# Patient Record
Sex: Female | Born: 1975
Health system: Southern US, Community
[De-identification: ages and names within clinical notes are randomized; demographics above are authoritative.]

## PROBLEM LIST (undated history)

## (undated) ENCOUNTER — Emergency Department (HOSPITAL_COMMUNITY): Discharge status: Against Medical Advice | Payer: Self-pay

## (undated) DIAGNOSIS — F3281 Premenstrual dysphoric disorder: Secondary | ICD-10-CM

## (undated) DIAGNOSIS — F909 Attention-deficit hyperactivity disorder, unspecified type: Secondary | ICD-10-CM

## (undated) DIAGNOSIS — F419 Anxiety disorder, unspecified: Secondary | ICD-10-CM

## (undated) HISTORY — PX: TONSILLECTOMY: SUR1361

## (undated) HISTORY — PX: HERNIA REPAIR: SHX51

---

## 2004-01-05 ENCOUNTER — Inpatient Hospital Stay (HOSPITAL_COMMUNITY): Admission: RE | Admit: 2004-01-05 | Discharge: 2004-01-07 | Payer: Self-pay | Admitting: Psychiatry

## 2016-11-08 ENCOUNTER — Encounter (HOSPITAL_BASED_OUTPATIENT_CLINIC_OR_DEPARTMENT_OTHER): Payer: Self-pay | Admitting: Emergency Medicine

## 2016-11-08 ENCOUNTER — Emergency Department (HOSPITAL_BASED_OUTPATIENT_CLINIC_OR_DEPARTMENT_OTHER): Payer: 59

## 2016-11-08 ENCOUNTER — Emergency Department (HOSPITAL_BASED_OUTPATIENT_CLINIC_OR_DEPARTMENT_OTHER)
Admission: EM | Admit: 2016-11-08 | Discharge: 2016-11-08 | Disposition: A | Discharge status: Home / Self Care | Payer: 59 | Attending: Emergency Medicine | Admitting: Emergency Medicine

## 2016-11-08 DIAGNOSIS — R509 Fever, unspecified: Secondary | ICD-10-CM | POA: Diagnosis present

## 2016-11-08 DIAGNOSIS — N760 Acute vaginitis: Secondary | ICD-10-CM | POA: Diagnosis not present

## 2016-11-08 DIAGNOSIS — Z79899 Other long term (current) drug therapy: Secondary | ICD-10-CM | POA: Insufficient documentation

## 2016-11-08 DIAGNOSIS — F909 Attention-deficit hyperactivity disorder, unspecified type: Secondary | ICD-10-CM | POA: Insufficient documentation

## 2016-11-08 DIAGNOSIS — F17210 Nicotine dependence, cigarettes, uncomplicated: Secondary | ICD-10-CM | POA: Diagnosis not present

## 2016-11-08 HISTORY — DX: Anxiety disorder, unspecified: F41.9

## 2016-11-08 HISTORY — DX: Premenstrual dysphoric disorder: F32.81

## 2016-11-08 HISTORY — DX: Attention-deficit hyperactivity disorder, unspecified type: F90.9

## 2016-11-08 LAB — URINALYSIS, ROUTINE W REFLEX MICROSCOPIC
Bilirubin Urine: NEGATIVE
GLUCOSE, UA: NEGATIVE mg/dL
HGB URINE DIPSTICK: NEGATIVE
Ketones, ur: NEGATIVE mg/dL
Leukocytes, UA: NEGATIVE
Nitrite: NEGATIVE
Protein, ur: NEGATIVE mg/dL
SPECIFIC GRAVITY, URINE: 1.006 (ref 1.005–1.030)
pH: 7 (ref 5.0–8.0)

## 2016-11-08 LAB — COMPREHENSIVE METABOLIC PANEL
ALT: 9 U/L — AB (ref 14–54)
AST: 13 U/L — AB (ref 15–41)
Albumin: 4.1 g/dL (ref 3.5–5.0)
Alkaline Phosphatase: 53 U/L (ref 38–126)
Anion gap: 5 (ref 5–15)
BILIRUBIN TOTAL: 0.4 mg/dL (ref 0.3–1.2)
BUN: 8 mg/dL (ref 6–20)
CALCIUM: 8.9 mg/dL (ref 8.9–10.3)
CO2: 27 mmol/L (ref 22–32)
CREATININE: 0.7 mg/dL (ref 0.44–1.00)
Chloride: 102 mmol/L (ref 101–111)
GFR calc Af Amer: >60 mL/min (ref >60)
Glucose, Bld: 73 mg/dL (ref 65–99)
POTASSIUM: 4 mmol/L (ref 3.5–5.1)
Sodium: 134 mmol/L — ABNORMAL LOW (ref 135–145)
TOTAL PROTEIN: 6.1 g/dL — AB (ref 6.5–8.1)

## 2016-11-08 LAB — WET PREP, GENITAL
Sperm: NONE SEEN
Trich, Wet Prep: NONE SEEN
YEAST WET PREP: NONE SEEN

## 2016-11-08 LAB — CBC
HEMATOCRIT: 35.2 % — AB (ref 36.0–46.0)
Hemoglobin: 12.6 g/dL (ref 12.0–15.0)
MCH: 30.4 pg (ref 26.0–34.0)
MCHC: 35.8 g/dL (ref 30.0–36.0)
MCV: 85 fL (ref 78.0–100.0)
Platelets: 153 10*3/uL (ref 150–400)
RBC: 4.14 MIL/uL (ref 3.87–5.11)
RDW: 12 % (ref 11.5–15.5)
WBC: 7.3 10*3/uL (ref 4.0–10.5)

## 2016-11-08 LAB — PREGNANCY, URINE: Preg Test, Ur: NEGATIVE

## 2016-11-08 LAB — SEDIMENTATION RATE: Sed Rate: 0 mm/hr (ref 0–22)

## 2016-11-08 LAB — T4, FREE: FREE T4: 0.78 ng/dL (ref 0.61–1.12)

## 2016-11-08 LAB — TSH: TSH: 1.899 u[IU]/mL (ref 0.350–4.500)

## 2016-11-08 MED ORDER — METRONIDAZOLE 500 MG PO TABS: 500.0000 mg | ORAL_TABLET | Freq: Two times a day (BID) | ORAL | 0 refills | Status: DC

## 2016-11-08 MED FILL — metroNIDAZOLE 500 MG TABS: 500 | 7 days supply | Qty: 14 | Fill #0

## 2016-11-08 NOTE — ED Notes (Signed)
ED Provider at bedside. 

## 2016-11-08 NOTE — ED Triage Notes (Signed)
Pt reports fevers ranging from 99.4-100.5 for three months. Was at her psychiatrist appointment this am and was told to have it addressed with ua and cbc. She went to [facility] and they sent her here. Known to have tick bites but reports none this season. NAD at triage.

## 2016-11-08 NOTE — Discharge Instructions (Signed)
It was our pleasure to provide your ER care today - we hope that you feel better.  For vaginal discharge, take flagyl (antibiotic) as prescribed.  As we discussed, some test results remain pending, and the results should be back in the next 1-2 days.   Follow up with primary care doctor in the next 1-2 weeks - call to arrange appointment.   Return to ER if worse, new symptoms, new or severe pain, high fevers, other concern.

## 2016-11-08 NOTE — ED Notes (Signed)
Patient transported to X-ray 

## 2016-11-08 NOTE — ED Notes (Signed)
ED Provider at bedside for update 

## 2016-11-08 NOTE — ED Provider Notes (Signed)
[facility]   CSN: [id number] Arrival date & time: 11/08/16  7829     History   Chief Complaint Chief Complaint  Patient presents with  . Fever    HPI Lauren Frazier is a 41 y.o. female.  Patient c/o low grade fever in the past 3 months, t 99-100.  Denies other associated symptoms, no chills/sweats, no joint pain, no myalgias, no rash/skin lesions, no cough or uri c/o, no headaches, no sob or unusual doe, no abd pain, no vomiting or diarrhea, no dysuria or gu c/o.  Pt states approximately 3 months ago did have a couple shots for planned trip to [place].  Also states around that time only med change was new med Intuniv.  Denies any tick bites/ticks on body in the past year. Denies hx arthritis, rheumatologic, or autoimmune illness. No recent significant wt loss or gain. No skin changes. No heat intolerance. Also states had a new sexual contact 2-3 weeks ago, and has mild whitish vaginal discharge - requests eval for that as well. Denies pelvic or vaginal pain.    The history is provided by the patient.  Fever   Pertinent negatives include no chest pain, no diarrhea, no vomiting, no headaches, no sore throat and no cough.    Past Medical History:  Diagnosis Date  . ADHD   . Anxiety   . PMDD (premenstrual dysphoric disorder)     There are no active problems to display for this patient.   Past Surgical History:  Procedure Laterality Date  . CESAREAN SECTION    . HERNIA REPAIR    . TONSILLECTOMY      OB History    No data available       Home Medications    Prior to Admission medications   Medication Sig Start Date End Date Taking? Authorizing Provider  diazepam (VALIUM) 2 MG tablet Take 5 mg by mouth every 6 (six) hours as needed for anxiety.   Yes [provider]  guanFACINE (INTUNIV) 2 MG TB24 ER tablet Take 2 mg by mouth daily.   Yes [provider]  lamoTRIgine (LAMICTAL) 100 MG tablet Take 100 mg by mouth 2 (two)  times daily.   Yes [provider]  norethindrone (MICRONOR,CAMILA,ERRIN) 0.35 MG tablet Take 1 tablet by mouth daily.   Yes [provider]    Family History No family history on file.  Social History Social History  Substance Use Topics  . Smoking status: Current Every Day Smoker    Types: E-cigarettes  . Smokeless tobacco: Never Used  . Alcohol use No     Allergies   Patient has no known allergies.   Review of Systems Review of Systems  Constitutional: Positive for fever. Negative for chills.  HENT: Negative for sinus pain and sore throat.   Eyes: Negative for redness.  Respiratory: Negative for cough and shortness of breath.   Cardiovascular: Negative for chest pain.  Gastrointestinal: Negative for abdominal pain, diarrhea and vomiting.  Genitourinary: Negative for dysuria, flank pain and pelvic pain.  Musculoskeletal: Negative for arthralgias, back pain, joint swelling, myalgias and neck pain.  Skin: Negative for rash.  Neurological: Negative for headaches.  Hematological: Negative for adenopathy.  Psychiatric/Behavioral: Negative for confusion.     Physical Exam Updated Vital Signs BP (!) 104/54 (BP Location: Left Arm)   Pulse 78   Temp 98.7 F (37.1 C) (Oral)   Resp 18   Ht 5\' 4"  (1.626 m)   Wt 56.7  kg   LMP 10/22/2016 (Exact Date)   SpO2 99%   BMI 21.46 kg/m   Physical Exam  Constitutional: She appears well-developed and well-nourished. No distress.  HENT:  Nose: Nose normal.  Mouth/Throat: Oropharynx is clear and moist.  Eyes: Conjunctivae are normal. No scleral icterus.  Neck: Neck supple. No tracheal deviation present. No thyromegaly present.  Cardiovascular: Normal rate, regular rhythm, normal heart sounds and intact distal pulses.  Exam reveals no gallop and no friction rub.   No murmur heard. Pulmonary/Chest: Effort normal and breath sounds normal. No respiratory distress.  Abdominal: Soft. Normal appearance and bowel  sounds are normal. She exhibits no distension and no mass. There is no tenderness. There is no rebound and no guarding. No hernia.  No hsm.   Genitourinary:  Genitourinary Comments: No cva tenderness. Normal external gu exam. V mild whitish vaginal discharge. No cmt. No adnexal tenderness/mass.   Musculoskeletal: She exhibits no edema.  Lymphadenopathy:    She has no cervical adenopathy.  Neurological: She is alert.  Skin: Skin is warm and dry. No rash noted. She is not diaphoretic.  Psychiatric:  Mildly anxious.   Nursing note and vitals reviewed.    ED Treatments / Results  Labs (all labs ordered are listed, but only abnormal results are displayed) Results for orders placed or performed during the hospital encounter of 11/08/16  Wet prep, genital  Result Value Ref Range   Yeast Wet Prep HPF POC NONE SEEN NONE SEEN   Trich, Wet Prep NONE SEEN NONE SEEN   Clue Cells Wet Prep HPF POC PRESENT (A) NONE SEEN   WBC, Wet Prep HPF POC MANY (A) NONE SEEN   Sperm NONE SEEN   Pregnancy, urine  Result Value Ref Range   Preg Test, Ur NEGATIVE NEGATIVE  Urinalysis, Routine w reflex microscopic  Result Value Ref Range   Color, Urine YELLOW YELLOW   APPearance CLEAR CLEAR   Specific Gravity, Urine 1.006 1.005 - 1.030   pH 7.0 5.0 - 8.0   Glucose, UA NEGATIVE NEGATIVE mg/dL   Hgb urine dipstick NEGATIVE NEGATIVE   Bilirubin Urine NEGATIVE NEGATIVE   Ketones, ur NEGATIVE NEGATIVE mg/dL   Protein, ur NEGATIVE NEGATIVE mg/dL   Nitrite NEGATIVE NEGATIVE   Leukocytes, UA NEGATIVE NEGATIVE  CBC  Result Value Ref Range   WBC 7.3 4.0 - 10.5 K/uL   RBC 4.14 3.87 - 5.11 MIL/uL   Hemoglobin 12.6 12.0 - 15.0 g/dL   HCT 16.1 (L) 09.6 - 04.5 %   MCV 85.0 78.0 - 100.0 fL   MCH 30.4 26.0 - 34.0 pg   MCHC 35.8 30.0 - 36.0 g/dL   RDW 40.9 81.1 - 91.4 %   Platelets 153 150 - 400 K/uL  Comprehensive metabolic panel  Result Value Ref Range   Sodium 134 (L) 135 - 145 mmol/L   Potassium 4.0 3.5 -  5.1 mmol/L   Chloride 102 101 - 111 mmol/L   CO2 27 22 - 32 mmol/L   Glucose, Bld 73 65 - 99 mg/dL   BUN 8 6 - 20 mg/dL   Creatinine, Ser 7.82 0.44 - 1.00 mg/dL   Calcium 8.9 8.9 - 95.6 mg/dL   Total Protein 6.1 (L) 6.5 - 8.1 g/dL   Albumin 4.1 3.5 - 5.0 g/dL   AST 13 (L) 15 - 41 U/L   ALT 9 (L) 14 - 54 U/L   Alkaline Phosphatase 53 38 - 126 U/L   Total Bilirubin 0.4 0.3 -  1.2 mg/dL   GFR calc non Af Amer >60 >60 mL/min   GFR calc Af Amer >60 >60 mL/min   Anion gap 5 5 - 15  Sedimentation rate  Result Value Ref Range   Sed Rate 0 0 - 22 mm/hr   Dg Chest 2 View  Result Date: 11/08/2016 CLINICAL DATA:  41 year old female low grade fever daily for the past 3 months. Nonsmoker. Initial encounter. EXAM: CHEST  2 VIEW COMPARISON:  None. FINDINGS: No infiltrate, congestive heart failure or pneumothorax. No plain film evidence of adenopathy or pulmonary malignancy. Heart size within normal limits. Minimal curvature thoracic spine. IMPRESSION: No active cardiopulmonary disease. Electronically Signed   By: Lacy DuverneySteven  Olson M.D.   On: 11/08/2016 12:21    EKG  EKG Interpretation None       Radiology Dg Chest 2 View  Result Date: 11/08/2016 CLINICAL DATA:  41 year old female low grade fever daily for the past 3 months. Nonsmoker. Initial encounter. EXAM: CHEST  2 VIEW COMPARISON:  None. FINDINGS: No infiltrate, congestive heart failure or pneumothorax. No plain film evidence of adenopathy or pulmonary malignancy. Heart size within normal limits. Minimal curvature thoracic spine. IMPRESSION: No active cardiopulmonary disease. Electronically Signed   By: Lacy DuverneySteven  Olson M.D.   On: 11/08/2016 12:21    Procedures Procedures (including critical care time)  Medications Ordered in ED Medications - No data to display   Initial Impression / Assessment and Plan / ED Course  I have reviewed the triage vital signs and the nursing notes.  Pertinent labs & imaging results that were available during  my care of the patient were reviewed by me and considered in my medical decision making (see chart for details).  Pt requests lab workup - labs ordered.   Discussed labs with pt, as well as informed of labs pending.  rec close pcp f/u.     Final Clinical Impressions(s) / ED Diagnoses   Final diagnoses:  None    New Prescriptions New Prescriptions   No medications on file     Cathren LaineSteinl, Rontae Inglett, MD 11/08/16 1322

## 2016-11-08 NOTE — ED Notes (Signed)
Pelvic cart at beside 

## 2016-11-08 NOTE — ED Notes (Signed)
Reports thick, white discharge with no pain.  Also reports recently having vaccinations to travel to [place]. A few months ago.

## 2016-11-09 LAB — ANTINUCLEAR ANTIBODIES, IFA: ANA Ab, IFA: NEGATIVE

## 2016-11-09 LAB — GC/CHLAMYDIA PROBE AMP (~~LOC~~) NOT AT ARMC
Chlamydia: NEGATIVE
Neisseria Gonorrhea: NEGATIVE

## 2016-11-11 ENCOUNTER — Encounter: Payer: Self-pay | Admitting: Medical

## 2016-11-11 ENCOUNTER — Ambulatory Visit (INDEPENDENT_AMBULATORY_CARE_PROVIDER_SITE_OTHER): Payer: 59 | Admitting: Medical

## 2016-11-11 VITALS — BP 103/57 | HR 88 | Temp 98.1°F | Resp 16 | Ht 64.0 in | Wt 126.0 lb

## 2016-11-11 DIAGNOSIS — J029 Acute pharyngitis, unspecified: Secondary | ICD-10-CM | POA: Diagnosis not present

## 2016-11-11 DIAGNOSIS — H9202 Otalgia, left ear: Secondary | ICD-10-CM | POA: Diagnosis not present

## 2016-11-11 DIAGNOSIS — W57XXXA Bitten or stung by nonvenomous insect and other nonvenomous arthropods, initial encounter: Secondary | ICD-10-CM | POA: Diagnosis not present

## 2016-11-11 DIAGNOSIS — R5383 Other fatigue: Secondary | ICD-10-CM

## 2016-11-11 DIAGNOSIS — R509 Fever, unspecified: Secondary | ICD-10-CM

## 2016-11-11 DIAGNOSIS — R195 Other fecal abnormalities: Secondary | ICD-10-CM

## 2016-11-11 DIAGNOSIS — N760 Acute vaginitis: Secondary | ICD-10-CM

## 2016-11-11 DIAGNOSIS — T148XXA Other injury of unspecified body region, initial encounter: Secondary | ICD-10-CM

## 2016-11-11 DIAGNOSIS — B9689 Other specified bacterial agents as the cause of diseases classified elsewhere: Secondary | ICD-10-CM | POA: Diagnosis not present

## 2016-11-11 LAB — COMPREHENSIVE METABOLIC PANEL
ALBUMIN: 4.6 g/dL (ref 3.6–5.1)
ALT: 7 U/L (ref 6–29)
AST: 12 U/L (ref 10–30)
Alkaline Phosphatase: 56 U/L (ref 33–115)
BUN: 9 mg/dL (ref 7–25)
CALCIUM: 9.6 mg/dL (ref 8.6–10.2)
CHLORIDE: 104 mmol/L (ref 98–110)
CO2: 26 mmol/L (ref 20–31)
Creat: 0.91 mg/dL (ref 0.50–1.10)
GLUCOSE: 89 mg/dL (ref 65–99)
POTASSIUM: 4.8 mmol/L (ref 3.5–5.3)
Sodium: 140 mmol/L (ref 135–146)
Total Bilirubin: 0.4 mg/dL (ref 0.2–1.2)
Total Protein: 6.4 g/dL (ref 6.1–8.1)

## 2016-11-11 LAB — POC URINALSYSI DIPSTICK (AUTOMATED)
BILIRUBIN UA: NEGATIVE
Glucose, UA: NEGATIVE
Ketones, UA: NEGATIVE
LEUKOCYTES UA: NEGATIVE
NITRITE UA: NEGATIVE
PH UA: 6 (ref 5.0–8.0)
Protein, UA: NEGATIVE
RBC UA: NEGATIVE
Spec Grav, UA: >=1.03 — AB (ref 1.010–1.025)
UROBILINOGEN UA: NEGATIVE U/dL — AB

## 2016-11-11 LAB — POCT RAPID STREP A (OFFICE): Rapid Strep A Screen: NEGATIVE

## 2016-11-11 NOTE — Progress Notes (Signed)
Subjective:    Patient ID: Lauren Frazier, female    DOB: 06/01/1976, 41 y.o.   MRN: [id number]  HPI  I have reviewed pt PMH, PSH, FH, Social History and Surgical History  Pt is [employer], Stretches and walks her  dog, 3 cups of coffee a day, separated- twins. 41 yo.  Pt in states she has had low grade fever for about 3-724months. 99.4 to 100.3.  Pt states she does feel tired. Fatigue level varies.   Pt went to ED on Nov 07, 2016.  Pt had negative, cbc, cmp, tsh, and sed rate. Neg cxr as well and negative ana.  Pt is on intuniv for ADHD.(Dr. Emelda FearKim Dantsey psychiatrist). It is causing side effect of low bp. So she is being taperd off.   Pt has 2 year history of Valium use. Hx of anxiety.(possible plan to taper off in future)  Also is on lamictal for mod stabilization(generalized anxiety disorder)  Also has history of pmdd- she is on noretthindrone.(Dr. Joanna Pufforne).   Pelvic- was + for bv in ED. Was treated with flagyl. Pt had faintt dc and now resolved.  With gyn- she had 2 work ups for std. About 6 months apart. Pt states test were all negative twice.   Pt also states she moving to [place] for 2 weeks. Pt had vaccines and went to health ept. Pt has not started vivotif. Has not taken malerone.  Pt dad may have had irritable bowel syndrome.   Some recent diarrhea But has hx of intermittent of constipation and then regular stools.   Last 7 days some loose stools.   Pt states gained weight after coming off of stimulant. On adderall. 20 mg tid for 2 years.  Pt used to smoke. Smoked intermittently since 41 yo.   Family history of breast cancer on mom side. Maternal.  Lt ear discomfort weird for one month. But faint pain for 3 days.  Pt remembers last year had tick crawling on her last summer.Maybe 15 bites. She never got sickness related to this and never got tick bite studies.  Review of Systems  Constitutional: Positive for fever. Negative for chills and fatigue.    HENT: Positive for ear pain and sore throat. Negative for congestion, drooling, ear discharge, mouth sores, postnasal drip, sinus pain, sinus pressure, tinnitus and trouble swallowing.        Sometimes st.  Eyes: Negative for photophobia and pain.  Respiratory: Negative for cough, chest tightness, shortness of breath and wheezing.   Cardiovascular: Negative for chest pain and palpitations.  Gastrointestinal: Negative for abdominal pain, diarrhea, nausea and vomiting.  Endocrine: Negative for polydipsia and polyuria.  Genitourinary: Negative for decreased urine volume, difficulty urinating, dysuria, enuresis, flank pain, urgency, vaginal bleeding, vaginal discharge and vaginal pain.  Musculoskeletal: Negative for back pain, gait problem, joint swelling, myalgias and neck pain.       1 day last week joint pain in rt hand 3rd or 4th digit.  Skin: Negative for rash.  Neurological: Negative for dizziness and headaches.  Hematological: Negative for adenopathy. Does not bruise/bleed easily.  Psychiatric/Behavioral: Negative for behavioral problems, confusion, self-injury and suicidal ideas. The patient is nervous/anxious. The patient is not hyperactive.     Past Medical History:  Diagnosis Date  . ADHD   . Anxiety   . PMDD (premenstrual dysphoric disorder)      Social History   Social History  . Marital status: Legally Separated    Spouse name: N/A  .  Number of children: N/A  . Years of education: N/A   Occupational History  . Not on file.   Social History Main Topics  . Smoking status: Current Every Day Smoker    Types: E-cigarettes  . Smokeless tobacco: Never Used  . Alcohol use No  . Drug use: No  . Sexual activity: Yes    Birth control/ protection: None   Other Topics Concern  . Not on file   Social History Narrative  . No narrative on file    Past Surgical History:  Procedure Laterality Date  . CESAREAN SECTION    . HERNIA REPAIR    . TONSILLECTOMY       History reviewed. No pertinent family history.  No Known Allergies  Current Outpatient Prescriptions on File Prior to Visit  Medication Sig Dispense Refill  . diazepam (VALIUM) 2 MG tablet Take 5 mg by mouth every 6 (six) hours as needed for anxiety.    . lamoTRIgine (LAMICTAL) 100 MG tablet Take 100 mg by mouth 2 (two) times daily.    . metroNIDAZOLE (FLAGYL) 500 MG tablet Take 1 tablet (500 mg total) by mouth 2 (two) times daily. 14 tablet 0  . norethindrone (MICRONOR,CAMILA,ERRIN) 0.35 MG tablet Take 1 tablet by mouth daily.     No current facility-administered medications on file prior to visit.     BP (!) 103/57 (BP Location: Right Arm, Patient Position: Sitting, Cuff Size: Normal)   Pulse 88   Temp 98.1 F (36.7 C) (Oral)   Resp 16   Ht 5\' 4"  (1.626 m)   Wt 126 lb (57.2 kg)   LMP 10/22/2016 (Exact Date)   SpO2 100%   BMI 21.63 kg/m       Objective:   Physical Exam  General  Mental Status - Alert. General Appearance - Well groomed. Not in acute distress.  Skin Rashes- No Rashes.  HEENT Head- Normal. Ear Auditory Canal - Left- Normal. Right - Normal.Tympanic Membrane- Left- faint dull at best  Right- Normal. Eye Sclera/Conjunctiva- Left- Normal. Right- Normal. Nose & Sinuses Nasal Mucosa- Left-  Boggy and Congested. Right-  Boggy and  Congested.Bilateral maxillary and frontal sinus pressure. Mouth & Throat Lips: Upper Lip- Normal: no dryness, cracking, pallor, cyanosis, or vesicular eruption. Lower Lip-Normal: no dryness, cracking, pallor, cyanosis or vesicular eruption. Buccal Mucosa- Bilateral- No Aphthous ulcers. Oropharynx- No Discharge or Erythema. Tonsils: Characteristics- Bilateral- faint  Erythema or Congestion. Size/Enlargement- Bilateral- No enlargement. Discharge- bilateral-None.  Neck Neck- Supple. No Masses.   Chest and Lung Exam Auscultation: Breath Sounds:-Clear even and unlabored.  Cardiovascular Auscultation:Rythm- Regular, rate  and rhythm. Murmurs & Other Heart Sounds:Ausculatation of the heart reveal- No Murmurs.  Lymphatic Head & Neck General Head & Neck Lymphatics: Bilateral: Description- No Localized lymphadenopathy.(no supraclaviular and no axillary lymph nodes palpated.   Abdomen Inspection:-Inspeection Normal. Palpation/Percussion:Note:No mass. Palpation and Percussion of the abdomen reveal- Non Tender, Non Distended + BS, no rebound or guarding.    Neurologic Cranial Nerve exam:- CN III-XII intact(No nystagmus), symmetric smile. Strength:- 5/5 equal and symmetric strength both upper and lower extremities.      Assessment & Plan:  For your hx of fatigue, low grade fever, st, and loose stools will get tick bite studies, cmp, throat culture, urine cultrue, ifob, and cmp.  If all test negative consider referral to hematologist.  If stool positive refer to GI for colonsocopy.  Please get records from gyn on all recent testing for std's.(hiv or hep studies).  So far  ED work up and chest xray negative.  Note regarding work up and evaluation I am not sure of pt faint ear discomfort as no obvious infection on exam. Also some loose stools recently but would not be considered cause of low grade fever over past 3 days. Will ask staff to call pt on Monday and ask if loose stools continue. If so consider stool panel studies. Ask aobut ear discomfort. If ear discomfort consider trial keflex.  I did spend 45 minutes. At least 50% of time takling with pt getting history on her low grade fever and disussing differential diagnosis and approach to work up.  Aliza Moret, Ramon Dredge, PA-C

## 2016-11-11 NOTE — Patient Instructions (Addendum)
For your hx of fatigue, low grade fever, st, and loose stools will get tick bite studies, cmp, throat culture, urine cultrue, ifob, and cmp.   If all test negative consider referral to hematologist.  If stool positive refer to GI for colonsocopy.  Please get records from gyn on all recent testing for std's.(hiv or hep studies).  So far ED work up and chest xray negative.

## 2016-11-12 LAB — URINE CULTURE: Organism ID, Bacteria: NO GROWTH

## 2016-11-13 LAB — CULTURE, GROUP A STREP

## 2016-11-14 ENCOUNTER — Encounter: Payer: Self-pay | Admitting: Medical

## 2016-11-14 ENCOUNTER — Telehealth: Payer: Self-pay | Admitting: Medical

## 2016-11-14 LAB — QUANTIFERON TB GOLD ASSAY (BLOOD)
INTERFERON GAMMA RELEASE ASSAY: NEGATIVE
MITOGEN-NIL SO: 9.94 [IU]/mL
QUANTIFERON NIL VALUE: 0.04 [IU]/mL
Quantiferon Tb Ag Minus Nil Value: <0 IU/mL

## 2016-11-14 LAB — ROCKY MTN SPOTTED FVR ABS PNL(IGG+IGM)
RMSF IGM: NOT DETECTED
RMSF IgG: NOT DETECTED

## 2016-11-14 LAB — LYME AB/WESTERN BLOT REFLEX: B burgdorferi Ab IgG+IgM: <0.9 Index (ref <0.90)

## 2016-11-14 NOTE — Telephone Encounter (Signed)
Caller name: Irving Burtonmily Relation to pt: self Call back number: [phone] Pharmacy:  Reason for call: Pt states was seen on Friday (11-11-2016) and states fever would not go down with Tylenol, pt wanted to know what can she can do to get well, pt has some concern and question and would like provider to call her ASAP. Please advise.

## 2016-11-14 NOTE — Telephone Encounter (Signed)
Saw this request after work. I resulted her labs and will ask RN staff to call her tomorrow. Asking them to call since pt wanted call asap and expect to be busy seeing pt in office tomorrow in morning.

## 2016-11-16 ENCOUNTER — Telehealth: Payer: Self-pay | Admitting: Medical

## 2016-11-16 NOTE — Telephone Encounter (Signed)
I printed pt work note. Please get off printer and make sure I sign. Then have her pick up or fax.     Note pt on my chart told me ok for me to put down she was being worked up for fever of unkown origin.

## 2016-11-17 ENCOUNTER — Other Ambulatory Visit (INDEPENDENT_AMBULATORY_CARE_PROVIDER_SITE_OTHER): Payer: 59

## 2016-11-17 ENCOUNTER — Telehealth: Payer: Self-pay

## 2016-11-17 ENCOUNTER — Telehealth: Payer: Self-pay | Admitting: Medical

## 2016-11-17 ENCOUNTER — Other Ambulatory Visit: Payer: Self-pay | Admitting: Emergency Medicine

## 2016-11-17 DIAGNOSIS — R5383 Other fatigue: Secondary | ICD-10-CM

## 2016-11-17 DIAGNOSIS — R195 Other fecal abnormalities: Secondary | ICD-10-CM | POA: Diagnosis not present

## 2016-11-17 LAB — LYME ABY, WSTRN BLT IGG & IGM W/BANDS
B burgdorferi IgG Abs (IB): NEGATIVE
B burgdorferi IgM Abs (IB): NEGATIVE
LYME DISEASE 23 KD IGG: NONREACTIVE
LYME DISEASE 28 KD IGG: NONREACTIVE
LYME DISEASE 30 KD IGG: NONREACTIVE
LYME DISEASE 39 KD IGM: NONREACTIVE
LYME DISEASE 41 KD IGM: NONREACTIVE
LYME DISEASE 45 KD IGG: NONREACTIVE
LYME DISEASE 93 KD IGG: NONREACTIVE
Lyme Disease 18 kD IgG: NONREACTIVE
Lyme Disease 23 kD IgM: NONREACTIVE
Lyme Disease 39 kD IgG: NONREACTIVE
Lyme Disease 41 kD IgG: NONREACTIVE
Lyme Disease 58 kD IgG: NONREACTIVE
Lyme Disease 66 kD IgG: NONREACTIVE

## 2016-11-17 LAB — FECAL OCCULT BLOOD, IMMUNOCHEMICAL: FECAL OCCULT BLD: NEGATIVE

## 2016-11-17 NOTE — Telephone Encounter (Signed)
Patient called back fax number [phone]

## 2016-11-17 NOTE — Telephone Encounter (Signed)
Faxed work note to fax number given by patient.

## 2016-11-17 NOTE — Telephone Encounter (Signed)
I printed a work note for this patient last night. But this morning was not on printer. Can you find that and re-print please.

## 2016-11-17 NOTE — Telephone Encounter (Signed)
Called patient to let her know work note is ready for pick up/fax. Need fax number if to be faxed. Left message for return call

## 2016-11-23 ENCOUNTER — Ambulatory Visit (INDEPENDENT_AMBULATORY_CARE_PROVIDER_SITE_OTHER): Payer: 59 | Admitting: Medical

## 2016-11-23 ENCOUNTER — Encounter: Payer: Self-pay | Admitting: Medical

## 2016-11-23 VITALS — BP 119/65 | HR 95 | Temp 98.0°F | Resp 16 | Ht 64.0 in | Wt 126.6 lb

## 2016-11-23 DIAGNOSIS — R197 Diarrhea, unspecified: Secondary | ICD-10-CM

## 2016-11-23 DIAGNOSIS — H9202 Otalgia, left ear: Secondary | ICD-10-CM

## 2016-11-23 DIAGNOSIS — R509 Fever, unspecified: Secondary | ICD-10-CM

## 2016-11-23 MED ORDER — CEPHALEXIN 500 MG PO CAPS
500.0000 mg | ORAL_CAPSULE | Freq: Two times a day (BID) | ORAL | 0 refills | Status: DC
Start: 2016-11-23 — End: 2017-01-04

## 2016-11-23 NOTE — Progress Notes (Signed)
Subjective:    Patient ID: Lauren Frazier, female    DOB: 1975/08/01, 41 y.o.   MRN: [id number]  HPI  Pt in for a follow up.  Pt states she is still having very low grade fevers. 100.2, 99.7, 99.5, 98.6, 100.3 100.0, 99.4-98.9, 100.4, 99.1, and 99.5.   Pt labs done so far negative fecal occult  blood, negative rapid strep, negative urine culture, negative cxr, neg quantiferon tb test, negative tick bite studies. Normal wbc count in ED.  Sed rate was 0. Pt thyroid test was normal as well.  Pt did have bv in the past by ED. She was treated with flagyl.  Pt was on intuniv in past for ADD. Pt has been valium 5 mg three times a day in the past. New psychiatrist has plan to stop this/taper her off. Pt not sure when she will be tapered off.   Pt has been on hormone norethindrone. She wants to know if this could be cause. Pt has pmdd.   Pt in past had been on stimulant for 2 years. She tapered off and finished in March. Tapered off of adderal extended release from January to march.  Pt does note mild dull sensation to her left tm for about 3 weeks.    Note pt current temp is 98. Also note her temp on last visit was 98.1. Temp in ED when they cheked. 98.7.  Pt depression screening score 10. No suicidal or homicidal thoughts. She see psychiartirst regular basis.   Review of Systems  Constitutional: Negative for chills, fatigue and fever.  Respiratory: Negative for cough, chest tightness, shortness of breath and wheezing.   Cardiovascular: Negative for chest pain and palpitations.  Gastrointestinal: Positive for constipation and diarrhea. Negative for abdominal distention, abdominal pain, nausea and rectal pain.       Hx of ibs pattern.  Pt did have some diarrhea predominant last week was diarrhea.   Musculoskeletal: Negative for back pain, myalgias and neck pain.  Neurological: Negative for dizziness, syncope, weakness, numbness and headaches.  Hematological: Negative for adenopathy.  Does not bruise/bleed easily.  Psychiatric/Behavioral: Negative for behavioral problems, confusion, dysphoric mood, self-injury and suicidal ideas. The patient is nervous/anxious.     Past Medical History:  Diagnosis Date  . ADHD   . Anxiety   . PMDD (premenstrual dysphoric disorder)      Social History   Social History  . Marital status: Legally Separated    Spouse name: N/A  . Number of children: N/A  . Years of education: N/A   Occupational History  . Not on file.   Social History Main Topics  . Smoking status: Current Every Day Smoker    Types: E-cigarettes  . Smokeless tobacco: Never Used  . Alcohol use No  . Drug use: No  . Sexual activity: Yes    Birth control/ protection: None   Other Topics Concern  . Not on file   Social History Narrative  . No narrative on file    Past Surgical History:  Procedure Laterality Date  . CESAREAN SECTION    . HERNIA REPAIR    . TONSILLECTOMY      No family history on file.  No Known Allergies  Current Outpatient Prescriptions on File Prior to Visit  Medication Sig Dispense Refill  . diazepam (VALIUM) 2 MG tablet Take 5 mg by mouth every 6 (six) hours as needed for anxiety.    . lamoTRIgine (LAMICTAL) 100 MG tablet Take 100 mg by mouth  2 (two) times daily.    . norethindrone (MICRONOR,CAMILA,ERRIN) 0.35 MG tablet Take 1 tablet by mouth daily.     No current facility-administered medications on file prior to visit.     BP 119/65 (BP Location: Right Arm, Patient Position: Sitting, Cuff Size: Normal)   Pulse 95   Temp 98 F (36.7 C) (Oral)   Resp 16   Ht '5\' 4"'  (1.626 m)   Wt 126 lb 9.6 oz (57.4 kg)   SpO2 99%   BMI 21.73 kg/m       Objective:   Physical Exam  General Mental Status- Alert. General Appearance- Not in acute distress.   General  Mental Status - Alert. General Appearance - Well groomed. Not in acute distress.  Skin Rashes- No Rashes.  HEENT Head- Normal. Ear Auditory Canal - Left-  Normal. Right - Normal.Tympanic Membrane- Left- mild dull  Right- Normal. Eye Sclera/Conjunctiva- Left- Normal. Right- Normal. Nose & Sinuses Nasal Mucosa- Left-  Boggy and Congested. Right-  Boggy and  Congested.Bilateral maxillary and frontal sinus pressure. Mouth & Throat Lips: Upper Lip- Normal: no dryness, cracking, pallor, cyanosis, or vesicular eruption. Lower Lip-Normal: no dryness, cracking, pallor, cyanosis or vesicular eruption. Buccal Mucosa- Bilateral- No Aphthous ulcers. Oropharynx- No Discharge or Erythema. Tonsils: Characteristics- Bilateral- No Erythema or Congestion. Size/Enlargement- Bilateral- No enlargement. Discharge- bilateral-None.   Skin General: Color- Normal Color. Moisture- Normal Moisture.  Neck Carotid Arteries- Normal color. Moisture- Normal Moisture. No carotid bruits. No JVD.  Chest and Lung Exam Auscultation: Breath Sounds:-Normal.  Cardiovascular Auscultation:Rythm- Regular. Murmurs & Other Heart Sounds:Auscultation of the heart reveals- No Murmurs.  Abdomen Inspection:-Inspeection Normal. Palpation/Percussion:Note:No mass. Palpation and Percussion of the abdomen reveal- Non Tender, Non Distended + BS, no rebound or guarding.   Neurologic Cranial Nerve exam:- CN III-XII intact(No nystagmus), symmetric smile. Finger to Nose:- Normal/Intact Strength:- 5/5 equal and symmetric strength both upper and lower extremities.      Assessment & Plan:  Fairly extensive work up so far negative for your low grade fever.  Based on your exam today(left ear still dull) and your described faint discomfort will rx keflex. While this may not be cause of fever I don't want to miss treating something  relatively simple as we expand work up.  If you have diarrhea as you describe intermittently then  turn in  stool panel kit.  Will try to review material to see if by chance hormone you are on can increase temp?  Follow up with here early July or as  needed  Keep checking temperature over next 2 weeks. It would be interesting to see if after antibiotic you temp drop more in 98 range.  Referral to hematologist placed. Would keep this  appointment unless all the sudden your  fevers resolve.

## 2016-11-23 NOTE — Patient Instructions (Addendum)
Fairly extensive work up so far negative for your low grade fever.  Based on your exam today(left ear still dull) and your described faint discomfort will rx keflex. While this may not be cause of fever I don't want to miss treating something  relatively simple as we expand work up.  If you have diarrhea as you describe intermittently then  turn in  stool panel kit.  Will try to review material to see if by chance hormone you are on can increase temp?  Follow up with here early July or as needed  Keep checking temperature over next 2 weeks. It would be interesting to see if after antibiotic you temp drop more in 98 range.  Referral to hematologist placed. Would keep this  appointment unless all the sudden your  fevers resolve.

## 2016-11-25 ENCOUNTER — Ambulatory Visit: Payer: 59 | Admitting: Medical

## 2016-12-07 ENCOUNTER — Telehealth: Payer: Self-pay | Admitting: Medical

## 2016-12-07 NOTE — Telephone Encounter (Signed)
I'm not seen the patient, I'm covering for PCP. She is 41, having FUO for apparently 4 weeks. If she is having headaches, rash, high fever or feeling poorly: Recommend a visit to the ER. Definitely needs to keep the visit with oncology. Otherwise, just to be sure we will repeat a chest x-ray, CMP, CBC. Dx FUO

## 2016-12-07 NOTE — Telephone Encounter (Signed)
Tried to reach pt no answer. Pt voice mail is full.

## 2016-12-07 NOTE — Telephone Encounter (Signed)
Pt reports still having low grade fever and pain in her ear. Pt will be going to hematology Monday. Pt states she found out that she has mold and mildew in her home wants to know if that could be the reason she's still having symptoms. Pt wants to know if she should go to ENT or keep hematology appt. Pt states she is not having any raspatory issues.

## 2016-12-07 NOTE — Telephone Encounter (Signed)
Caller name: Relationship to patient: Self Can be reached: [phone]  Pharmacy:  Reason for call: Patient request call back to discuss mole in her home. Patient still has ear pain and fever. States Ramon Dredgedward placed her on antibiotic but she is still having symptoms.

## 2016-12-08 NOTE — Telephone Encounter (Signed)
Notified pt. 

## 2016-12-09 ENCOUNTER — Other Ambulatory Visit (HOSPITAL_BASED_OUTPATIENT_CLINIC_OR_DEPARTMENT_OTHER): Payer: 59

## 2016-12-09 ENCOUNTER — Ambulatory Visit: Payer: 59

## 2016-12-09 ENCOUNTER — Other Ambulatory Visit: Payer: 59

## 2016-12-09 ENCOUNTER — Ambulatory Visit (HOSPITAL_BASED_OUTPATIENT_CLINIC_OR_DEPARTMENT_OTHER): Payer: 59 | Admitting: Hematology & Oncology

## 2016-12-09 VITALS — BP 121/47 | HR 73 | Temp 98.1°F | Resp 17 | Wt 128.0 lb

## 2016-12-09 DIAGNOSIS — Z720 Tobacco use: Secondary | ICD-10-CM | POA: Diagnosis not present

## 2016-12-09 DIAGNOSIS — R51 Headache: Secondary | ICD-10-CM | POA: Diagnosis not present

## 2016-12-09 DIAGNOSIS — R509 Fever, unspecified: Secondary | ICD-10-CM

## 2016-12-09 LAB — CBC WITH DIFFERENTIAL (CANCER CENTER ONLY)
BASO#: 0 10*3/uL (ref 0.0–0.2)
BASO%: 0.1 % (ref 0.0–2.0)
EOS ABS: 0.1 10*3/uL (ref 0.0–0.5)
EOS%: 0.9 % (ref 0.0–7.0)
HCT: 39.7 % (ref 34.8–46.6)
HEMOGLOBIN: 14.2 g/dL (ref 11.6–15.9)
LYMPH#: 2.4 10*3/uL (ref 0.9–3.3)
LYMPH%: 30.9 % (ref 14.0–48.0)
MCH: 30.9 pg (ref 26.0–34.0)
MCHC: 35.8 g/dL (ref 32.0–36.0)
MCV: 87 fL (ref 81–101)
MONO#: 0.5 10*3/uL (ref 0.1–0.9)
MONO%: 6.3 % (ref 0.0–13.0)
NEUT%: 61.8 % (ref 39.6–80.0)
NEUTROS ABS: 4.8 10*3/uL (ref 1.5–6.5)
Platelets: 223 10*3/uL (ref 145–400)
RBC: 4.59 10*6/uL (ref 3.70–5.32)
RDW: 12.3 % (ref 11.1–15.7)
WBC: 7.8 10*3/uL (ref 3.9–10.0)

## 2016-12-09 LAB — LACTATE DEHYDROGENASE: LDH: 153 U/L (ref 125–245)

## 2016-12-09 LAB — CHCC SATELLITE - SMEAR

## 2016-12-09 NOTE — Progress Notes (Signed)
Referral MD  Reason for Referral: Fever of unknown origin   Chief Complaint  Patient presents with  . New Patient (Initial Visit)  : Hopefully you can help me.  HPI: Lauren Frazier is a very nice 41 year old white female. She actually is about to go on a mission trip to [place]. She will be leaving in July. She has twins, one boy and one girl, who will be going with her.  She's had these low-grade temperatures for about 6 months. She's had no foreign travel. She's had no change in medications is being placed on a progesterone agent because of PMDD. This is helped quite a bit.  She has seen her family doctor. She's had lab work done. She's had Lyme titers done. She's had other titers done. Everything so far has come back negative.  She's been tested for tuberculosis.  There is no risk factor for HIV or hepatitis.  She is multitalented. 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  She's had no weight loss or weight gain. There's been no cough or shortness of breath. She has had a normal   chest x-ray.  She has had some headaches.  There's been some occasional abdominal discomfort. There is no obvious diarrhea or constipation. She has no dysuria.  She does not smoke.  She has had past surgery without any difficulties. Her spleen is still in.  She is looking for to this mission trip over to [place]. I think she'll be over there for 2 or 3 weeks. It sounds incredibly exciting for her. She wants to be able to feel well going over there.  She's had no arthralgias or myalgias.  There's been no leg swelling.  Overall, her performance status is ECOG 0.   Past Medical History:  Diagnosis Date  . ADHD   . Anxiety   . PMDD (premenstrual dysphoric disorder)   :  Past Surgical History:  Procedure Laterality Date  . CESAREAN SECTION    . HERNIA REPAIR    . TONSILLECTOMY    :   Current Outpatient Prescriptions:  .   amphetamine-dextroamphetamine (ADDERALL XR) 30 MG 24 hr capsule, Adderall XR 30 mg capsule,extended release, Disp: , Rfl:  .  cephALEXin (KEFLEX) 500 MG capsule, Take 1 capsule (500 mg total) by mouth 2 (two) times daily., Disp: 14 capsule, Rfl: 0 .  diazepam (VALIUM) 2 MG tablet, Take 5 mg by mouth every 6 (six) hours as needed for anxiety., Disp: , Rfl:  .  lamoTRIgine (LAMICTAL) 100 MG tablet, Take 100 mg by mouth 2 (two) times daily., Disp: , Rfl:  .  norethindrone (MICRONOR,CAMILA,ERRIN) 0.35 MG tablet, Take 1 tablet by mouth daily., Disp: , Rfl: :  :  No Known Allergies:  No family history on file.:  Social History   Social History  . Marital status: Legally Separated    Spouse name: N/A  . Number of children: N/A  . Years of education: N/A   Occupational History  . Not on file.   Social History Main Topics  . Smoking status: Current Every Day Smoker    Types: E-cigarettes  . Smokeless tobacco: Never Used  . Alcohol use No  . Drug use: No  . Sexual activity: Yes    Birth control/ protection: None   Other Topics Concern  . Not on file   Social History Narrative  . No narrative on file  :  Pertinent items are noted in HPI.  Exam:Well-developed well-nourished white female in no obvious distress. Vital signs show temperature of 98.1. Pulse 73. Blood pressure 121/47. Weight is 128 pounds. Head and neck exam shows no ocular or oral lesions. She has no palpable cervical or supraclavicular lymph nodes. Thyroid is nonpalpable. Lungs are clear bilaterally. Cardiac exam regular rate and rhythm with no murmurs, rubs or bruits. Abdomen is soft. She has good bowel sounds. There is no fluid wave. There is no palpable hepatomegaly. Her spleen tip my be palpable with deep inspiration. Back exam shows no tenderness over the spine, ribs or hips. Extremities shows no clubbing, cyanosis or edema. There is no swelling over her long bones. She has good range of motion of her joints. Skin exam  shows no rashes, ecchymoses or petechia. Neurological exam shows no focal neurological deficits.    Recent Labs  12/09/16 1106  WBC 7.8  HGB 14.2  HCT 39.7  PLT 223 Platelet count consistent in citrate   No results for input(s): NA, K, CL, CO2, GLUCOSE, BUN, CREATININE, CALCIUM in the last 72 hours.  Blood smear review:  Normochromic and normocytic population of red blood cells. There are no nucleated red blood cells. There are no teardrop cells. I see no rouleau formation. She has no target cells. White cells appear normal in morphology and maturation. There is no immature myeloid or lymphoid forms. Platelets are well granulated.  Pathology: None     Assessment and Plan:  Lauren Frazier is a very nice 41 year old white female. She has fever of unknown origin.  I just cannot find any obvious hematologic issue. Her white blood cells appear normal under the microscope. She has a normal white cell differential.  I'm not sure if I can palpate her spleen with inspiration. As such, I will see about doing an abdominal ultrasound. I think this was very reasonable. If there is anything on the ultrasound, then we will proceed with a CT scan.  I think it would be reasonable to do an MRI of the brain. She is having some headaches. The temperature center is in the hypothalamus. I think it would be reasonable to check this to make sure there is nothing going on up there. She is agreeable.  I don't know she is to be referred to infectious disease. I cannot see anything on her exam that would suggest an underlying infection. However, since she is going to [place], it might be reasonable to have an infectious disease specialist see her to see what they might be able to find, if anything, it may be to give her suggestions as to how to minimize infection risk over in [place].  I spent about 45 minutes with her. She is very nice. I discussed the situation with her. I went over her lab work. I tried to  reassure her that I did not think that there is any bloody issue. She does not need to have a bone marrow test done.  As nice as she is, I just do not think we have to get her back here for follow-up.

## 2016-12-10 ENCOUNTER — Ambulatory Visit (HOSPITAL_BASED_OUTPATIENT_CLINIC_OR_DEPARTMENT_OTHER)
Admission: RE | Admit: 2016-12-10 | Discharge: 2016-12-10 | Disposition: A | Discharge status: Home / Self Care | Payer: 59 | Source: Ambulatory Visit | Attending: Hematology & Oncology | Admitting: Hematology & Oncology

## 2016-12-10 DIAGNOSIS — R509 Fever, unspecified: Secondary | ICD-10-CM

## 2016-12-10 MED ORDER — GADOBENATE DIMEGLUMINE 529 MG/ML IV SOLN
10.0000 mL | Freq: Once | INTRAVENOUS | Status: AC | PRN
  Administered 2016-12-10: 10 mL via INTRAVENOUS

## 2016-12-12 ENCOUNTER — Telehealth: Payer: Self-pay | Admitting: *Deleted

## 2016-12-12 ENCOUNTER — Ambulatory Visit: Payer: 59 | Admitting: Hematology & Oncology

## 2016-12-12 NOTE — Telephone Encounter (Addendum)
Patient is aware of results  ----- Message from Josph MachoPeter R Ennever, MD sent at 12/12/2016  7:07 AM EDT ----- Call - both the MRI of the brain and the US of the abdomen are normal!!!  Have a very blessed mission trip in [place]!!!  Cindee LamePete

## 2017-01-04 ENCOUNTER — Encounter: Payer: Self-pay | Admitting: Medical

## 2017-01-04 ENCOUNTER — Ambulatory Visit (INDEPENDENT_AMBULATORY_CARE_PROVIDER_SITE_OTHER): Payer: 59 | Admitting: Medical

## 2017-01-04 VITALS — BP 116/50 | HR 92 | Temp 98.4°F | Resp 16 | Ht 64.0 in | Wt 126.6 lb

## 2017-01-04 DIAGNOSIS — J301 Allergic rhinitis due to pollen: Secondary | ICD-10-CM

## 2017-01-04 DIAGNOSIS — R197 Diarrhea, unspecified: Secondary | ICD-10-CM

## 2017-01-04 DIAGNOSIS — R5383 Other fatigue: Secondary | ICD-10-CM | POA: Diagnosis not present

## 2017-01-04 NOTE — Progress Notes (Signed)
Subjective:    Patient ID: Lauren Frazier, female    DOB: 02/09/1976, 41 y.o.   MRN: [id number]  HPI Subjective:    Patient ID: Lauren Frazier, female    DOB: 04/05/1976, 41 y.o.   MRN: [id number]  HPI   Pt in for follow up.    She just got back from mission trip. She got back from [place] on Monday.   Pt states she did not take malaria prior to going. She was supposed to take doxycycline. She states winter there. She states only got one mosquito bite on her neck.  Pt had st mild coming back from [place]. But then some st on Monday. She had some pnd and coughing up some clear mucous. Pt took some ibuprofen early today and states feels some better now. Pt speculates that she may have had allergy to something in [place].   Pt has had some diarrhea last 3 days when she got home. Faint bodyaches. Fatigue since just got home.  No fever today.   She states some intermittently since she saw Dr. Myna HidalgoEnnever. He did extensive work up for FUO (expanded my work up). Pt had ct abdomen which was negative. On mri of head was negative. Work up inititiated for fever based on pt report of baseline temp 98.4 but recent 99.9 on average   Pt is also decreasing her diazapam but will likely stay on lamictal. Tapering of. In past psychiatrist tapered her off adderal and intuniv.   Per pt psychiatrist thinks in past had mood disorder or bipolar.     Review of Systems  Constitutional: Negative for chills, fatigue and fever.  HENT: Negative for congestion, dental problem, hearing loss, mouth sores, postnasal drip, rhinorrhea, sinus pain, sinus pressure, sore throat and trouble swallowing.   Respiratory: Negative for cough, chest tightness, shortness of breath and wheezing.   Cardiovascular: Negative for chest pain and palpitations.  Gastrointestinal: Positive for diarrhea. Negative for abdominal pain, constipation, nausea and vomiting.  Genitourinary: Negative for dyspareunia, dysuria,  frequency, pelvic pain and urgency.  Musculoskeletal: Negative for arthralgias, back pain, joint swelling and neck stiffness.       Minimal faint bodyaches.  Skin: Negative for rash.  Neurological: Negative for dizziness, tremors, seizures, syncope, weakness, numbness and headaches.  Hematological: Negative for adenopathy. Does not bruise/bleed easily.  Psychiatric/Behavioral: Negative for behavioral problems and confusion. The patient is not nervous/anxious.         Past Medical History:  Diagnosis Date  . ADHD   . Anxiety   . PMDD (premenstrual dysphoric disorder)      Social History        Social History  . Marital status: Legally Separated    Spouse name: N/A  . Number of children: N/A  . Years of education: N/A      Occupational History  . Not on file.        Social History Main Topics  . Smoking status: Current Every Day Smoker    Types: E-cigarettes  . Smokeless tobacco: Never Used  . Alcohol use No  . Drug use: No  . Sexual activity: Yes    Birth control/ protection: None       Other Topics Concern  . Not on file      Social History Narrative  . No narrative on file         Past Surgical History:  Procedure Laterality Date  . CESAREAN SECTION    . HERNIA REPAIR    .  TONSILLECTOMY      No family history on file.  No Known Allergies        Current Outpatient Prescriptions on File Prior to Visit  Medication Sig Dispense Refill  . lamoTRIgine (LAMICTAL) 100 MG tablet Take 100 mg by mouth 2 (two) times daily.    . norethindrone (MICRONOR,CAMILA,ERRIN) 0.35 MG tablet Take 1 tablet by mouth daily.     No current facility-administered medications on file prior to visit.     BP (!) 116/50 (BP Location: Right Arm, Cuff Size: Normal)   Pulse 92   Temp 98.4 F (36.9 C) (Oral)   Resp 16   Ht 5\' 4"  (1.626 m)   Wt 126 lb 9.6 oz (57.4 kg)   LMP 12/29/2016   SpO2 99%   BMI 21.73 kg/m       Objective:    Physical Exam   General  Mental Status - Alert. General Appearance - Well groomed. Not in acute distress.  Skin Rashes- No Rashes.  HEENT Head- Normal. Ear Auditory Canal - Left- Normal. Right - Normal.Tympanic Membrane- Left- Normal. Right- Normal. Eye Sclera/Conjunctiva- Left- Normal. Right- Normal. Nose & Sinuses Nasal Mucosa- Left-   Not Boggy and Congested. Right-  Not  Boggy and  Congested.Bilateral maxillary and frontal sinus pressure. Mouth & Throat Lips: Upper Lip- Normal: no dryness, cracking, pallor, cyanosis, or vesicular eruption. Lower Lip-Normal: no dryness, cracking, pallor, cyanosis or vesicular eruption. Buccal Mucosa- Bilateral- No Aphthous ulcers. +pnd Oropharynx- No Discharge or Erythema. Tonsils: Characteristics- Bilateral- No Erythema or Congestion. Size/Enlargement- Bilateral- No enlargement. Discharge- bilateral-None.  Neck Neck- Supple. No Masses.   Chest and Lung Exam Auscultation: Breath Sounds:-Clear even and unlabored.  Cardiovascular Auscultation:Rythm- Regular, rate and rhythm. Murmurs & Other Heart Sounds:Ausculatation of the heart reveal- No Murmurs.  Lymphatic Head & Neck General Head & Neck Lymphatics: Bilateral: Description- No Localized lymphadenopathy.    Abdomen Inspection:-Inspection Normal.  Palpation/Perucssion: Palpation and Percussion of the abdomen reveal- Non Tender, No Rebound tenderness, No rigidity(Guarding) and No Palpable abdominal masses.  Liver:-Normal.  Spleen:- Normal.   Back- no cva pain on exam.       Assessment & Plan:  Please get stool studies done today. I think good idea as recommended this last time as well. More important now as you just got back from [place].  For mild pnd recently get xyzal otc.   You declined strep test for recent st. If st recurrent or constant notify me and will get.  Regarding malaria decided might test if you don't feel better by Monday and if stool  studies are negative.  Follow up date 2 weeks or as needed           Review of Systems     Objective:   Physical Exam        Assessment & Plan:

## 2017-01-04 NOTE — Patient Instructions (Addendum)
Please get stool studies done today. I think good idea as recommended this last time as well. More important now as you just got back from [place].  For mild pnd recently get xyzal otc.   You declined strep test for recent st. If st recurrent or constant notify me and will get.  Regarding malaria decided might test if you don't feel better by Monday and if stool studies are negative.  Follow up date 2 weeks or as needed

## 2017-06-23 ENCOUNTER — Ambulatory Visit: Payer: Self-pay | Admitting: Emergency Medicine

## 2017-06-23 ENCOUNTER — Ambulatory Visit (INDEPENDENT_AMBULATORY_CARE_PROVIDER_SITE_OTHER): Payer: 59 | Admitting: Medical

## 2017-06-23 ENCOUNTER — Telehealth: Payer: Self-pay | Admitting: Medical

## 2017-06-23 ENCOUNTER — Encounter: Payer: Self-pay | Admitting: Medical

## 2017-06-23 VITALS — BP 109/58 | HR 89 | Temp 97.8°F | Ht 64.0 in | Wt 126.0 lb

## 2017-06-23 DIAGNOSIS — J3489 Other specified disorders of nose and nasal sinuses: Secondary | ICD-10-CM | POA: Diagnosis not present

## 2017-06-23 DIAGNOSIS — R52 Pain, unspecified: Secondary | ICD-10-CM | POA: Diagnosis not present

## 2017-06-23 DIAGNOSIS — J029 Acute pharyngitis, unspecified: Secondary | ICD-10-CM | POA: Diagnosis not present

## 2017-06-23 MED ORDER — CEFTRIAXONE SODIUM 1 G IJ SOLR
1.0000 g | Freq: Once | INTRAMUSCULAR | Status: AC
  Administered 2017-06-23: 1 g via INTRAMUSCULAR

## 2017-06-23 MED ORDER — FLUCONAZOLE 100 MG PO TABS: 100.0000 mg | ORAL_TABLET | Freq: Every day | ORAL | 0 refills | Status: AC

## 2017-06-23 MED ORDER — FLUTICASONE PROPIONATE 50 MCG/ACT NA SUSP: 2.0000 | Freq: Every day | NASAL | 1 refills | Status: AC

## 2017-06-23 NOTE — Telephone Encounter (Signed)
Will you put in her rapid flu and strep test results. Had to cancel since could not close note on Friday.

## 2017-06-23 NOTE — Patient Instructions (Signed)
Your rapid strep test and flu test were both negative.  Since your throat pain still persists decided to send out throat culture as well.  You are not completely improved yet with antibiotic cefuroxime.  Since you have developed sinus pressure while on this antibiotic I do think it would be helpful to give you Rocephin 1 g injection today for possible sinusitis.  Rocephin is in the same class as cefuroxime but it has broader coverage.  I would continue the oral antibiotic given by the urgent care.  I am making Diflucan available in the event you get yeast infection. Also to help with nasal congestion I am prescribing Flonase.  Follow-up in 7 days or as needed.

## 2017-06-23 NOTE — Progress Notes (Signed)
Subjective:    Patient ID: Lauren Frazier, female    DOB: 12/29/1975, 41 y.o.   MRN: [id number]  HPI  Pt in for evaluation.  She went to local urgent care for body aches and sore throat. Hurt to swallow. Pt had flu and strep were both negative. The NP states throat looked so angry he considered antibiotic and did give cefuroxime. She stopped cefuroxime for day and half then restarted.  She was given cefuroxime 250 mg twice daily. Took for about 3 days before she stopped. Then restarted.  Pt denies any fever.   Pt has 39 year old kids.   Pt also reporting some left sinus area pain since last night.   Blowing out some mucous from her nose.    Review of Systems  HENT: Positive for sinus pressure, sinus pain and sore throat. Negative for trouble swallowing and voice change.   Respiratory: Negative for cough, chest tightness and wheezing.   Cardiovascular: Negative for chest pain and palpitations.  Gastrointestinal: Negative for abdominal pain and anal bleeding.  Musculoskeletal: Negative for arthralgias and back pain.  Neurological: Negative for dizziness, speech difficulty, weakness and headaches.  Hematological: Negative for adenopathy. Does not bruise/bleed easily.  Psychiatric/Behavioral: Negative for behavioral problems, confusion, self-injury and suicidal ideas. The patient is not nervous/anxious.     Past Medical History:  Diagnosis Date  . ADHD   . Anxiety   . PMDD (premenstrual dysphoric disorder)      Social History   Socioeconomic History  . Marital status: Legally Separated    Spouse name: Not on file  . Number of children: Not on file  . Years of education: Not on file  . Highest education level: Not on file  Social Needs  . Financial resource strain: Not on file  . Food insecurity - worry: Not on file  . Food insecurity - inability: Not on file  . Transportation needs - medical: Not on file  . Transportation needs - non-medical: Not on file    Occupational History  . Not on file  Tobacco Use  . Smoking status: Current Every Day Smoker    Types: E-cigarettes  . Smokeless tobacco: Never Used  Substance and Sexual Activity  . Alcohol use: No  . Drug use: No  . Sexual activity: Yes    Birth control/protection: None  Other Topics Concern  . Not on file  Social History Narrative  . Not on file    Past Surgical History:  Procedure Laterality Date  . CESAREAN SECTION    . HERNIA REPAIR    . TONSILLECTOMY      No family history on file.  No Known Allergies  Current Outpatient Medications on File Prior to Visit  Medication Sig Dispense Refill  . diazepam (VALIUM) 5 MG tablet Take 1/2 tablet every morning, 1/2 tablet midday and 1/2 tablet every evening.  1  . lamoTRIgine (LAMICTAL) 100 MG tablet Take 100 mg by mouth every evening.     . lamoTRIgine (LAMICTAL) 150 MG tablet Take 150 mg by mouth every morning.    . norethindrone (MICRONOR,CAMILA,ERRIN) 0.35 MG tablet Take 1 tablet by mouth daily.     No current facility-administered medications on file prior to visit.     BP (!) 109/58   Pulse 89   Temp 97.8 F (36.6 C) (Oral)   Ht 5\' 4"  (1.626 m)   Wt 126 lb (57.2 kg)   SpO2 99%   BMI 21.63 kg/m  Objective:   Physical Exam  General  Mental Status - Alert. General Appearance - Well groomed. Not in acute distress.  Skin Rashes- No Rashes.  HEENT Head- Normal. Ear Auditory Canal - Left- Normal. Right - Normal.Tympanic Membrane- Left- Normal. Right- Normal. Eye Sclera/Conjunctiva- Left- Normal. Right- Normal. Nose & Sinuses Nasal Mucosa- Left-  Boggy and Congested. Right-  Boggy and  Congested.Bilateral left side  maxillary and left side  frontal sinus pressure. Mouth & Throat Lips: Upper Lip- Normal: no dryness, cracking, pallor, cyanosis, or vesicular eruption. Lower Lip-Normal: no dryness, cracking, pallor, cyanosis or vesicular eruption. Buccal Mucosa- Bilateral- No Aphthous  ulcers. Oropharynx- No Discharge or Erythema. Tonsils: Characteristics- Bilateral- No Erythema or Congestion. Size/Enlargement- Bilateral- No enlargement. Discharge- bilateral-None.  Neck Neck- Supple. No Masses.   Chest and Lung Exam Auscultation: Breath Sounds:-Clear even and unlabored.  Cardiovascular Auscultation:Rythm- Regular, rate and rhythm. Murmurs & Other Heart Sounds:Ausculatation of the heart reveal- No Murmurs.  Lymphatic Head & Neck General Head & Neck Lymphatics: Bilateral: Description- No Localized lymphadenopathy.       Assessment & Plan:  Your rapid strep test and flu test were both negative.  Since your throat pain still persists decided to send out throat culture as well.  You are not completely improved yet with antibiotic cefuroxime.  Since you have developed sinus pressure while on this antibiotic I do think it would be helpful to give you Rocephin 1 g injection today for possible sinusitis.  Rocephin is in the same class as cefuroxime but it has broader coverage.  I would continue the oral antibiotic given by the urgent care.  I am making Diflucan available in the event you get yeast infection. Also to help with nasal congestion I am prescribing Flonase.  Follow-up in 7 days or as needed.  Joden Bonsall, Ramon DredgeEdward, PA-C

## 2017-06-25 LAB — CULTURE, GROUP A STREP
MICRO NUMBER:: 81457386
SPECIMEN QUALITY: ADEQUATE

## 2017-06-26 LAB — POCT RAPID STREP A (OFFICE): Rapid Strep A Screen: NEGATIVE

## 2017-06-26 LAB — POCT INFLUENZA A/B: INFLUENZA A, POC: NEGATIVE

## 2017-06-26 NOTE — Addendum Note (Signed)
Addended by: Wilford CornerSIERRA, Ethylene Reznick M on: 06/26/2017 08:21 AM   Modules accepted: Orders

## 2017-06-26 NOTE — Telephone Encounter (Signed)
ORDERS ENTERED AND FINALIZED.

## 2018-07-13 IMAGING — DX DG CHEST 2V
2 series · 2 of 2 positions shown · non-contrast
Comparison: None.

CLINICAL DATA: 41-year-old female low grade fever daily for the
past 3 months. Nonsmoker. Initial encounter.

EXAM:
CHEST  2 VIEW

[chest pa]
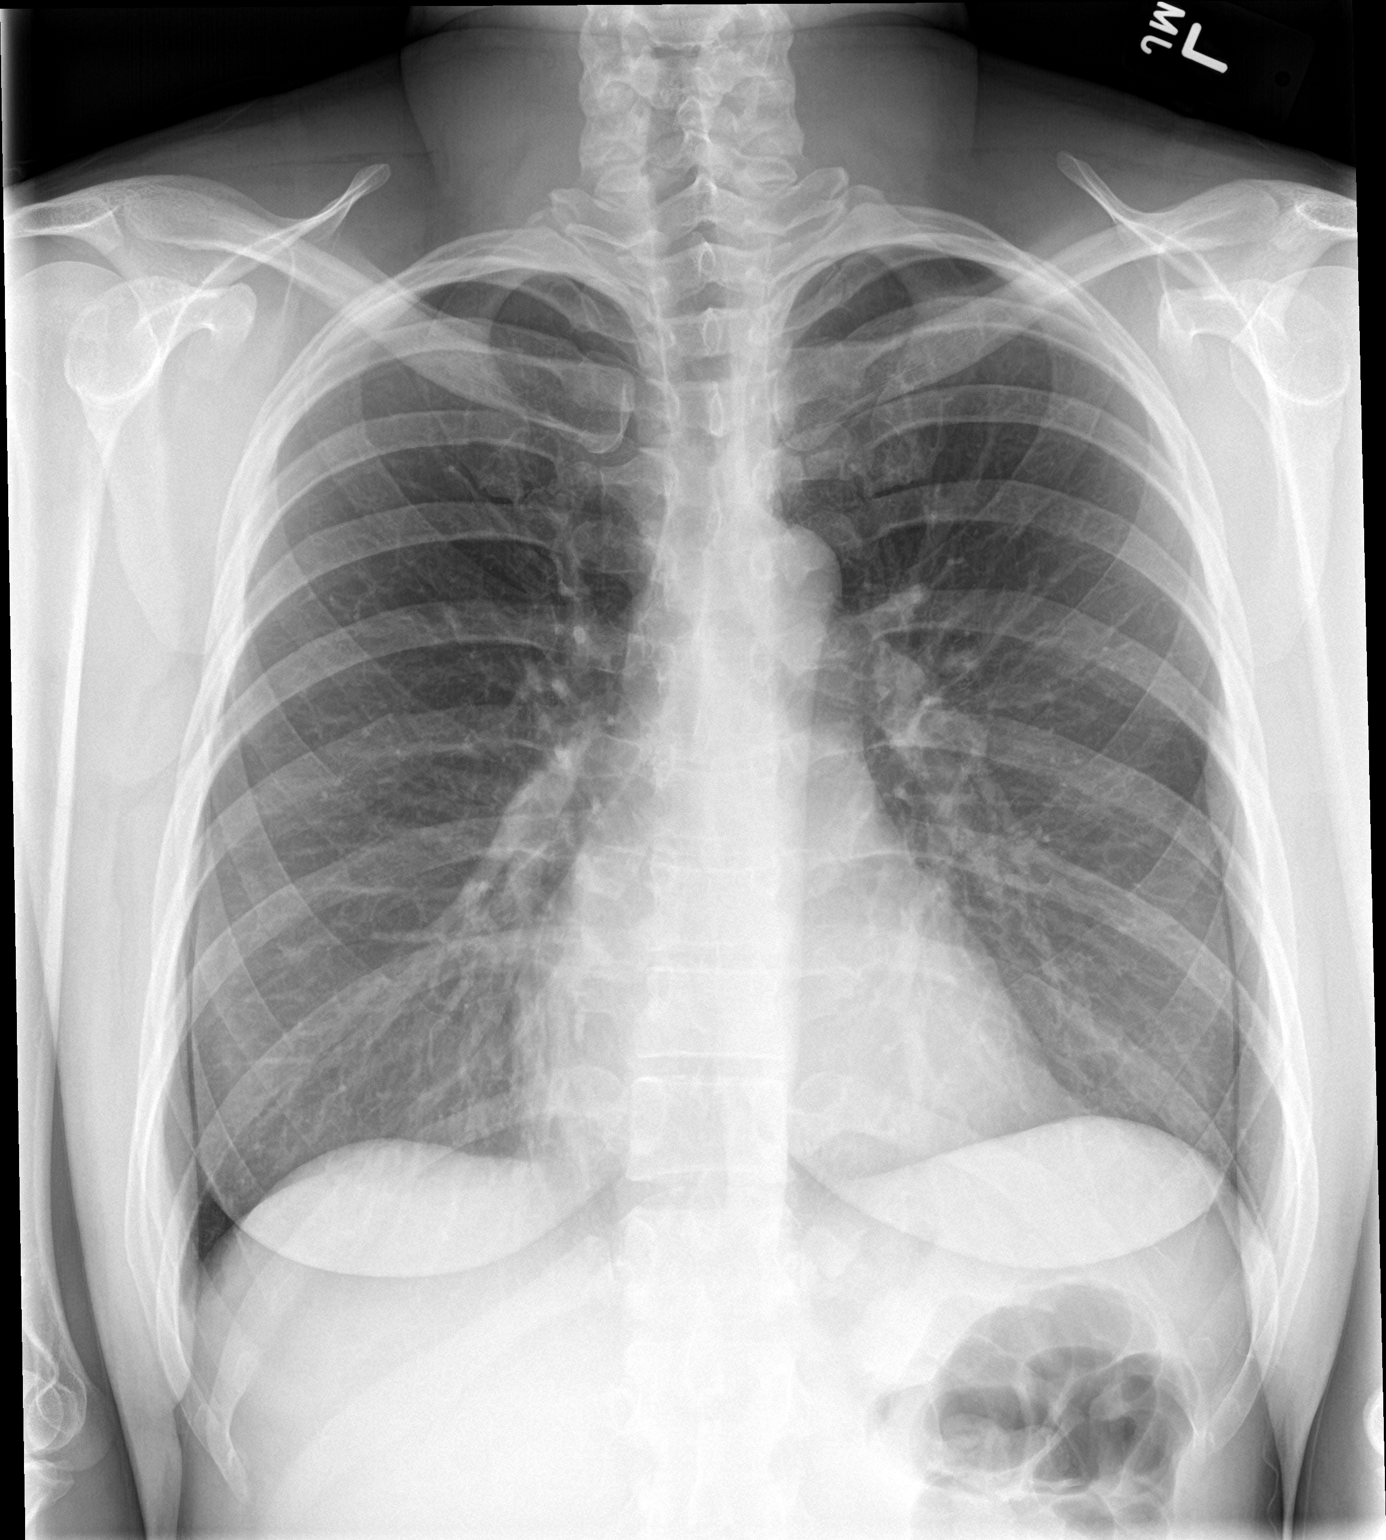

[chest lat]
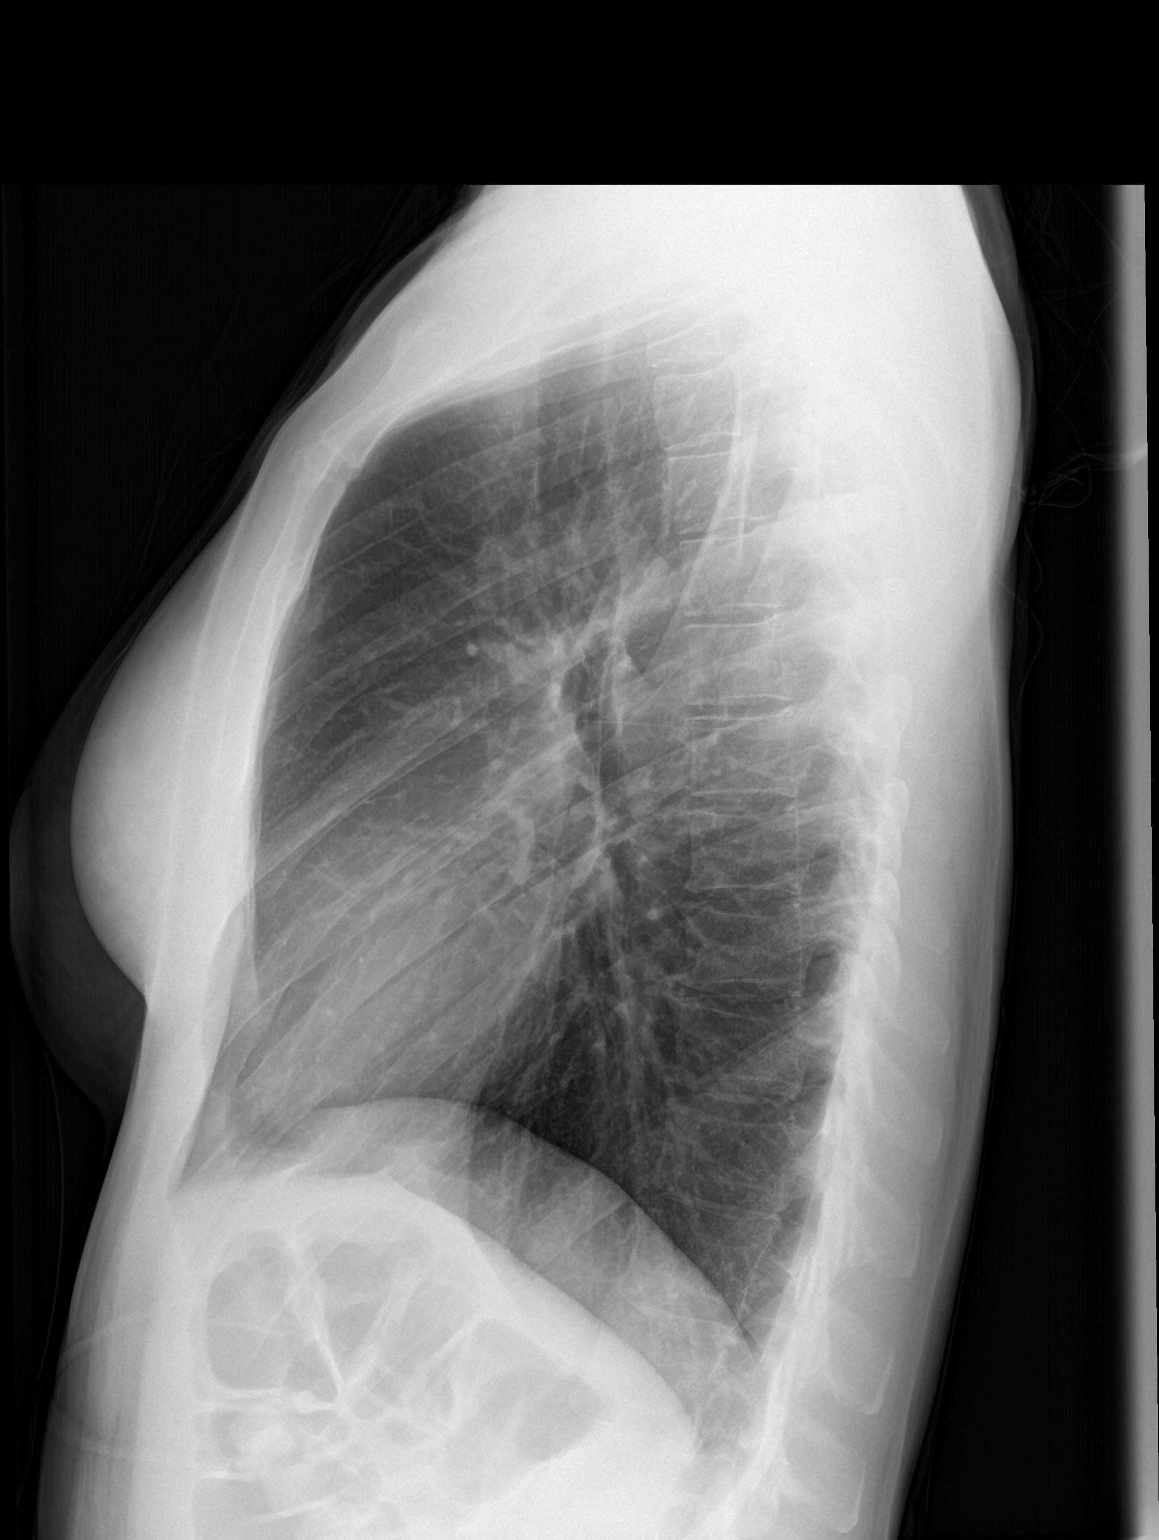

[2 of 2 positions shown; findings below may reference images not displayed]

FINDINGS: No infiltrate, congestive heart failure or pneumothorax.

No plain film evidence of adenopathy or pulmonary malignancy.

Heart size within normal limits.

Minimal curvature thoracic spine.
IMPRESSION: No active cardiopulmonary disease.

## 2018-08-14 IMAGING — MR MR HEAD WO/W CM
8 of 10 series · 34 of 48 positions shown · IV contrast (multihance)
Comparison: None.

CLINICAL DATA: Intermittent headaches and fever of unknown origin.

EXAM:
MRI HEAD WITHOUT AND WITH CONTRAST
TECHNIQUE: Multiplanar, multiecho pulse sequences of the brain and surrounding
structures were obtained without and with intravenous contrast.
CONTRAST:  10mL MULTIHANCE GADOBENATE DIMEGLUMINE 529 MG/ML IV SOLN

[Series 2: T1 · sagittal · 5.0mm · 0.45mm/px · 2 of 23 slices shown]
[im 1/23]
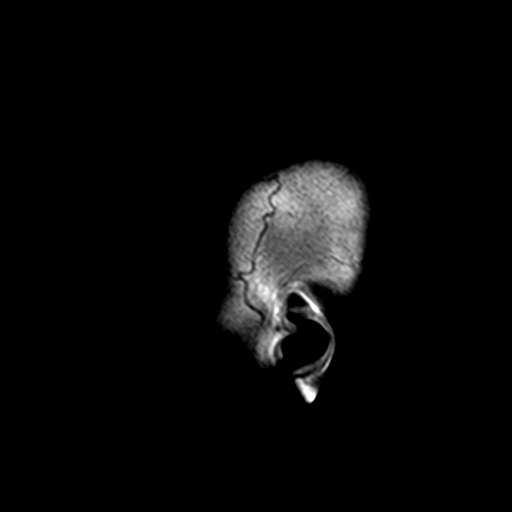
[im 23/23]
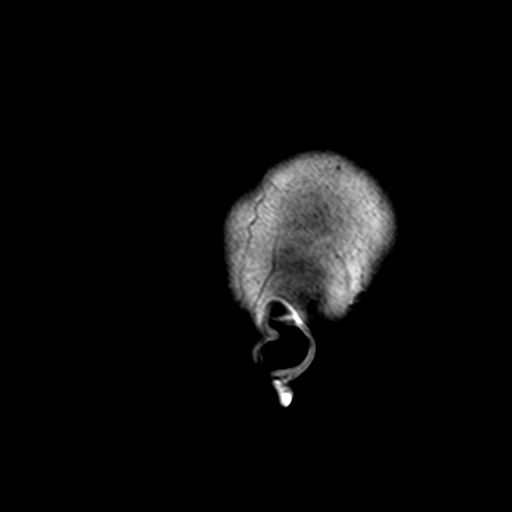

[Series 3: DWI · axial · 3.0mm · 1.80mm/px · z∈[-41,+114]mm · 11 of 96 slices shown (1 of 2)]
[im 1/96]
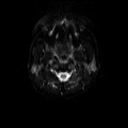
[im 10/96]
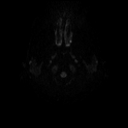
[im 20/96]
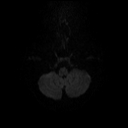
[im 29/96]
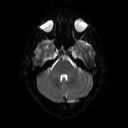
[im 39/96]
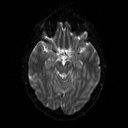
[im 48/96]
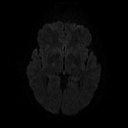
[im 58/96]
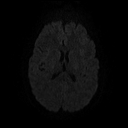
[im 67/96]
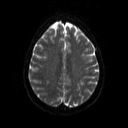
[im 77/96]
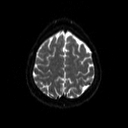
[im 86/96]
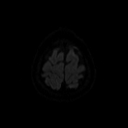
[im 96/96]
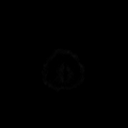

[Series 4: DWI · axial · 3.0mm · 1.80mm/px · z∈[-41,+114]mm · 6 of 48 slices shown (2 of 2)]
[im 1/48]
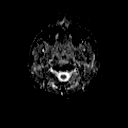
[im 10/48]
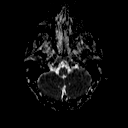
[im 19/48]
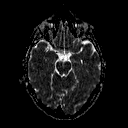
[im 29/48]
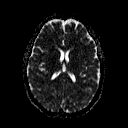
[im 38/48]
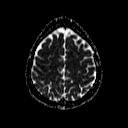
[im 48/48]
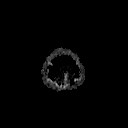

[Series 5: T2 · axial · 5.0mm · 0.66mm/px · z∈[-41,+113]mm · 3 of 23 slices shown (1 of 3)]
[im 1/23]
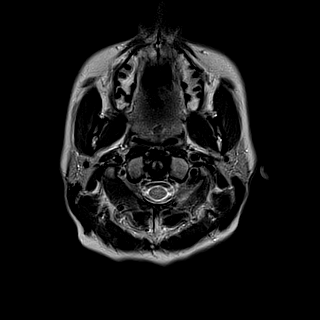
[im 12/23]
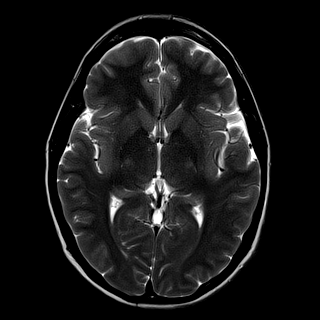
[im 23/23]
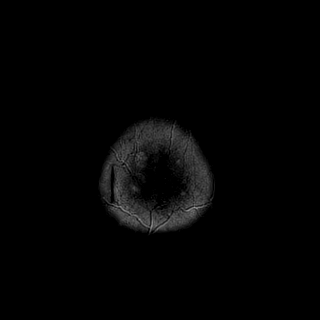

[Series 6: T2 · axial · 5.0mm · 0.41mm/px · z∈[-41,+113]mm · 3 of 23 slices shown (2 of 3)]
[im 1/23]
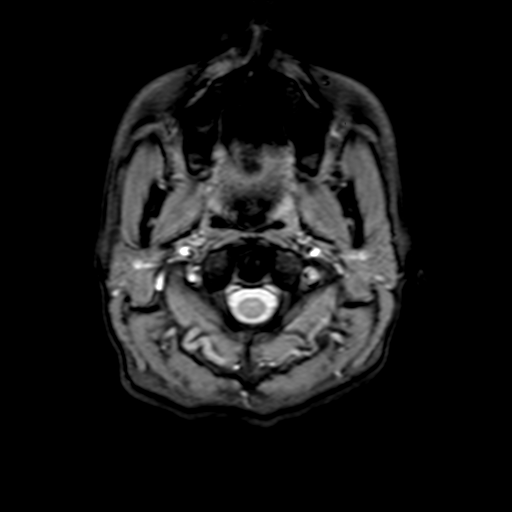
[im 12/23]
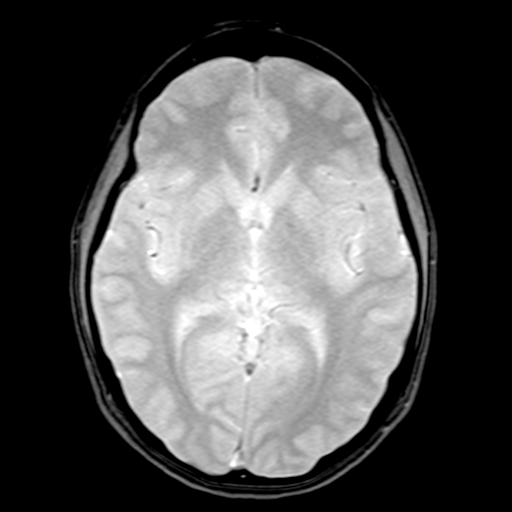
[im 23/23]
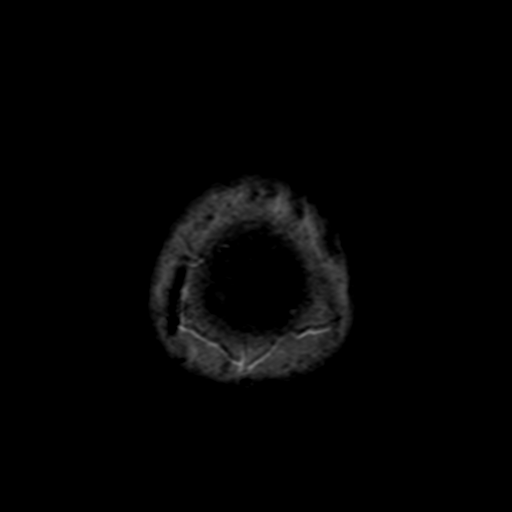

[Series 7: FLAIR · axial · 5.0mm · 0.41mm/px · z∈[-41,+113]mm · 3 of 23 slices shown]
[im 1/23]
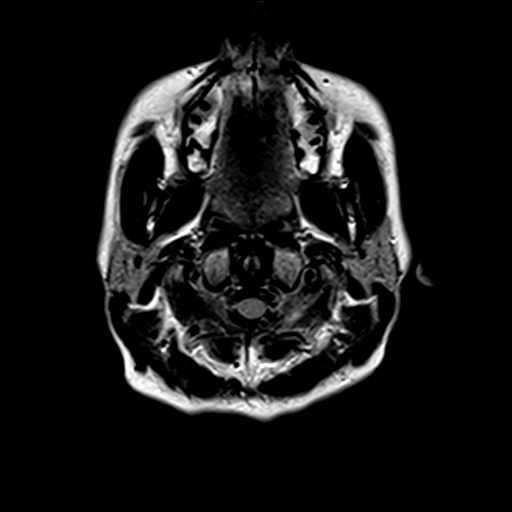
[im 12/23]
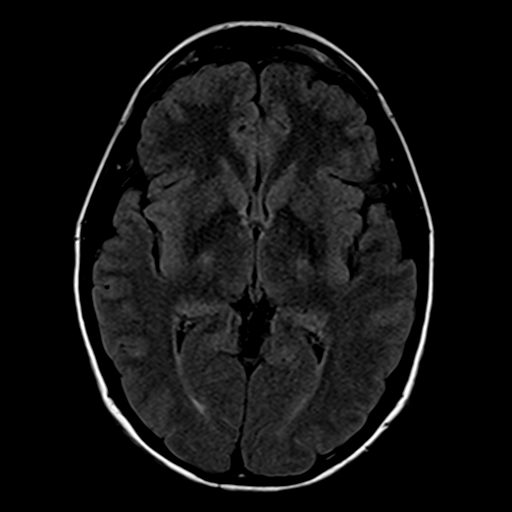
[im 23/23]
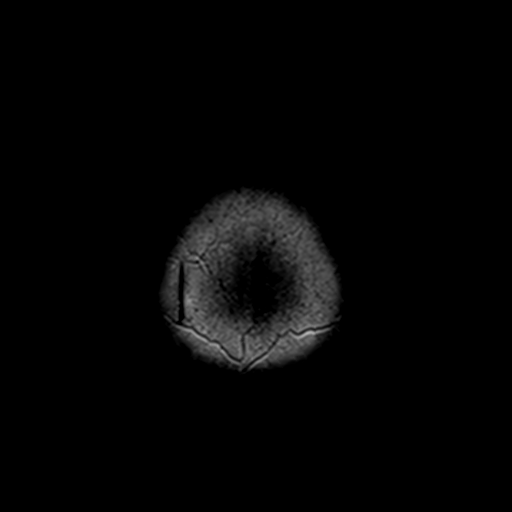

[Series 9: T2 · coronal · 5.0mm · 0.66mm/px · 3 of 30 slices shown (3 of 3)]
[im 1/30]
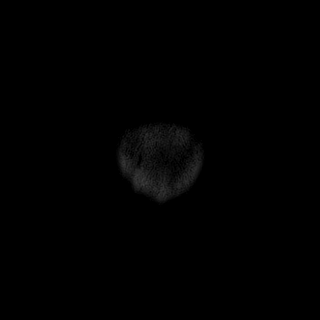
[im 15/30]
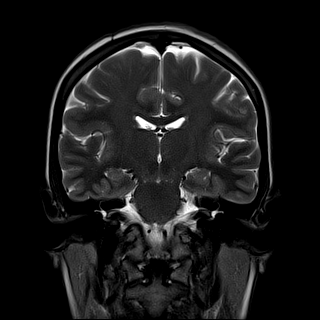
[im 30/30]
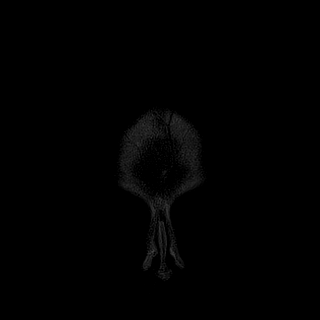

[Series 11: T1 post-contrast · coronal · 5.0mm · 0.41mm/px · 3 of 30 slices shown]
[im 1/30]
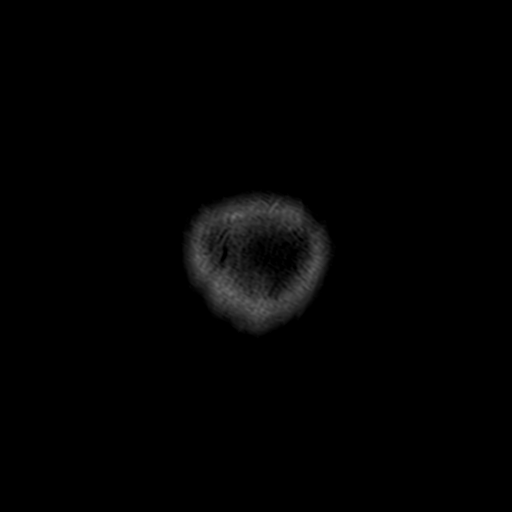
[im 15/30]
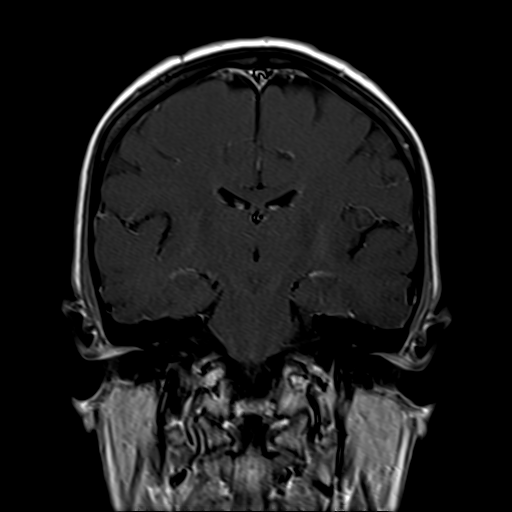
[im 30/30]
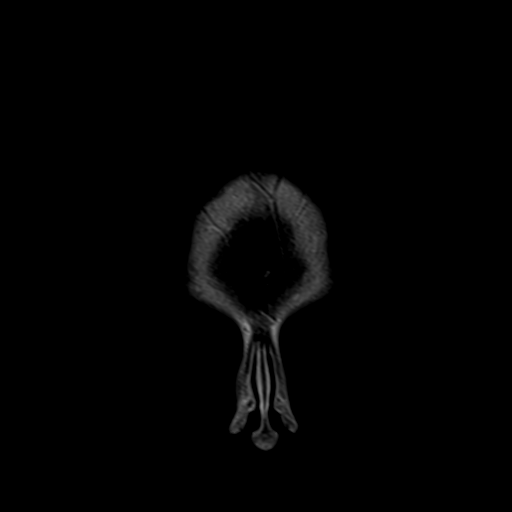

[34 of 48 positions shown; findings below may reference images not displayed]

FINDINGS: Brain: The midline structures are normal. There is no focal
diffusion restriction to indicate acute infarct. The brain
parenchymal signal is normal and there is no mass lesion. No
intraparenchymal hematoma or chronic microhemorrhage. Brain volume
is normal for age without age-advanced or lobar predominant atrophy.
The dura is normal and there is no extra-axial collection.

Vascular: Major intracranial arterial and venous sinus flow voids
are preserved.

Skull and upper cervical spine: The visualized skull base,
calvarium, upper cervical spine and extracranial soft tissues are
normal.

Sinuses/Orbits: No fluid levels or advanced mucosal thickening. No
mastoid effusion. Normal orbits.
IMPRESSION: Normal MRI of the brain for age.

## 2019-04-18 ENCOUNTER — Encounter: Payer: Self-pay | Admitting: Medical
